# Patient Record
Sex: Female | Born: 1995 | Race: White | Hispanic: No | Marital: Single | Smoking: Never smoker
Health system: Southern US, Community
[De-identification: ages and names within clinical notes are randomized; demographics above are authoritative.]

---

## 2017-02-20 ENCOUNTER — Ambulatory Visit (INDEPENDENT_AMBULATORY_CARE_PROVIDER_SITE_OTHER): Payer: Managed Care, Other (non HMO)

## 2017-02-20 ENCOUNTER — Ambulatory Visit
Admission: EM | Admit: 2017-02-20 | Discharge: 2017-02-20 | Disposition: A | Discharge status: Home / Self Care | Payer: Managed Care, Other (non HMO) | Attending: Family Medicine | Admitting: Family Medicine

## 2017-02-20 ENCOUNTER — Encounter: Payer: Self-pay | Admitting: Emergency Medicine

## 2017-02-20 DIAGNOSIS — Z79899 Other long term (current) drug therapy: Secondary | ICD-10-CM | POA: Insufficient documentation

## 2017-02-20 DIAGNOSIS — R110 Nausea: Secondary | ICD-10-CM

## 2017-02-20 DIAGNOSIS — K2900 Acute gastritis without bleeding: Secondary | ICD-10-CM | POA: Diagnosis not present

## 2017-02-20 DIAGNOSIS — E876 Hypokalemia: Secondary | ICD-10-CM | POA: Insufficient documentation

## 2017-02-20 DIAGNOSIS — R05 Cough: Secondary | ICD-10-CM | POA: Insufficient documentation

## 2017-02-20 DIAGNOSIS — M940 Chondrocostal junction syndrome [Tietze]: Secondary | ICD-10-CM | POA: Diagnosis not present

## 2017-02-20 DIAGNOSIS — Z880 Allergy status to penicillin: Secondary | ICD-10-CM | POA: Diagnosis not present

## 2017-02-20 DIAGNOSIS — Z3202 Encounter for pregnancy test, result negative: Secondary | ICD-10-CM | POA: Diagnosis not present

## 2017-02-20 DIAGNOSIS — R1010 Upper abdominal pain, unspecified: Secondary | ICD-10-CM | POA: Diagnosis not present

## 2017-02-20 LAB — CBC WITH DIFFERENTIAL/PLATELET
BASOS ABS: 0.1 10*3/uL (ref 0–0.1)
BASOS PCT: 1 %
EOS PCT: 0 %
Eosinophils Absolute: 0 10*3/uL (ref 0–0.7)
HCT: 39.8 % (ref 35.0–47.0)
Hemoglobin: 13.7 g/dL (ref 12.0–16.0)
LYMPHS PCT: 23 %
Lymphs Abs: 1.9 10*3/uL (ref 1.0–3.6)
MCH: 30.7 pg (ref 26.0–34.0)
MCHC: 34.4 g/dL (ref 32.0–36.0)
MCV: 89.5 fL (ref 80.0–100.0)
MONO ABS: 0.4 10*3/uL (ref 0.2–0.9)
Monocytes Relative: 4 %
Neutro Abs: 5.9 10*3/uL (ref 1.4–6.5)
Neutrophils Relative %: 72 %
PLATELETS: 271 10*3/uL (ref 150–440)
RBC: 4.45 MIL/uL (ref 3.80–5.20)
RDW: 12.2 % (ref 11.5–14.5)
WBC: 8.3 10*3/uL (ref 3.6–11.0)

## 2017-02-20 LAB — URINALYSIS, COMPLETE (UACMP) WITH MICROSCOPIC
BILIRUBIN URINE: NEGATIVE
Bacteria, UA: NONE SEEN
Glucose, UA: NEGATIVE mg/dL
KETONES UR: NEGATIVE mg/dL
LEUKOCYTES UA: NEGATIVE
NITRITE: NEGATIVE
PH: 5.5 (ref 5.0–8.0)
PROTEIN: NEGATIVE mg/dL
RBC / HPF: NONE SEEN RBC/hpf (ref 0–5)
SQUAMOUS EPITHELIAL / LPF: NONE SEEN
Specific Gravity, Urine: <1.005 — ABNORMAL LOW (ref 1.005–1.030)

## 2017-02-20 LAB — LIPASE, BLOOD: Lipase: 21 U/L (ref 11–51)

## 2017-02-20 LAB — AMYLASE: AMYLASE: 72 U/L (ref 28–100)

## 2017-02-20 LAB — COMPREHENSIVE METABOLIC PANEL
ALBUMIN: 4.9 g/dL (ref 3.5–5.0)
ALT: 13 U/L — AB (ref 14–54)
AST: 21 U/L (ref 15–41)
Alkaline Phosphatase: 51 U/L (ref 38–126)
Anion gap: 9 (ref 5–15)
BUN: 10 mg/dL (ref 6–20)
CHLORIDE: 102 mmol/L (ref 101–111)
CO2: 24 mmol/L (ref 22–32)
CREATININE: 0.85 mg/dL (ref 0.44–1.00)
Calcium: 9.5 mg/dL (ref 8.9–10.3)
GFR calc Af Amer: >60 mL/min (ref >60)
GLUCOSE: 98 mg/dL (ref 65–99)
POTASSIUM: 3.4 mmol/L — AB (ref 3.5–5.1)
SODIUM: 135 mmol/L (ref 135–145)
Total Bilirubin: 1 mg/dL (ref 0.3–1.2)
Total Protein: 8.4 g/dL — ABNORMAL HIGH (ref 6.5–8.1)

## 2017-02-20 LAB — PREGNANCY, URINE: Preg Test, Ur: NEGATIVE

## 2017-02-20 MED ORDER — ONDANSETRON 8 MG PO TBDP
8.0000 mg | ORAL_TABLET | Freq: Once | ORAL | Status: AC
  Administered 2017-02-20: 8 mg via ORAL

## 2017-02-20 MED ORDER — POTASSIUM CHLORIDE CRYS ER 20 MEQ PO TBCR: 40.0000 meq | EXTENDED_RELEASE_TABLET | Freq: Once | ORAL | Status: DC

## 2017-02-20 MED ORDER — ONDANSETRON 8 MG PO TBDP: 8.0000 mg | ORAL_TABLET | Freq: Three times a day (TID) | ORAL | 0 refills | Status: AC | PRN

## 2017-02-20 MED ORDER — MELOXICAM 15 MG PO TABS
15.0000 mg | ORAL_TABLET | Freq: Every day | ORAL | 0 refills | Status: AC
Start: 2017-02-20 — End: ?

## 2017-02-20 NOTE — ED Provider Notes (Signed)
[facility]    CSN: [id number] Arrival date & time: 02/20/17  1058     History   Chief Complaint Chief Complaint  Patient presents with  . Abdominal Pain  . Nausea    HPI Kathleen Krueger is a 21 y.o. female.   Patient with multiple problems and not communicating that well either. She is a 21 year old white female states that she's been sick now for about 2-3 days. She reports aching all over having nausea yesterday and dry heaving yesterday she dry heaves a lot and then this morning she started having chest pain. She denies ever having chest pain before or even having dry heaving act this before. When asked she was still nauseous she says no but she still wants to throw up which is a contradiction in terms. During the discussion with the patient and her friend often has to ask her the same her father was sick with a URI but that was about a week ago. She denies any pertinent past smoker history pertinent surgical history. She states that 10 being feeling great she feels about a 6 right now and that she feels miserable she reports having a cough earlier but not cough now she still does have abdominal pain as well as the chest pain.   The history is provided by the patient. No language interpreter was used.  Abdominal Pain  Pain location:  Epigastric Pain quality: fullness, gnawing and pressure   Pain quality: not cramping   Pain radiates to:  Chest Pain severity:  Moderate Onset quality:  Sudden Duration:  2 days Timing:  Constant Progression:  Worsening Chronicity:  New Context: not medication withdrawal and not previous surgeries   Relieved by:  Nothing Worsened by:  Nothing Associated symptoms: chest pain, fatigue and nausea   Associated symptoms: no fever, no shortness of breath and no sore throat   Risk factors: no alcohol abuse, no aspirin use, has not had multiple surgeries and no NSAID use     History reviewed. No pertinent past medical  history.  There are no active problems to display for this patient.   History reviewed. No pertinent surgical history.  OB History    No data available       Home Medications    Prior to Admission medications   Medication Sig Start Date End Date Taking? Authorizing Provider  meloxicam (MOBIC) 15 MG tablet Take 1 tablet (15 mg total) by mouth daily. 02/20/17   Hassan Rowan, MD  ondansetron (ZOFRAN ODT) 8 MG disintegrating tablet Take 1 tablet (8 mg total) by mouth every 8 (eight) hours as needed for nausea or vomiting. 02/20/17   Hassan Rowan, MD    Family History History reviewed. No pertinent family history.  Social History Social History  Substance Use Topics  . Smoking status: Never Smoker  . Smokeless tobacco: Never Used  . Alcohol use No     Allergies   Amoxicillin   Review of Systems Review of Systems  Constitutional: Positive for fatigue. Negative for fever.  HENT: Negative for sore throat.   Respiratory: Negative for shortness of breath.   Cardiovascular: Positive for chest pain.  Gastrointestinal: Positive for abdominal pain and nausea.  All other systems reviewed and are negative.    Physical Exam Triage Vital Signs ED Triage Vitals  Enc Vitals Group     BP 02/20/17 1132 110/69     Pulse Rate 02/20/17 1132 79     Resp 02/20/17 1132 16  Temp 02/20/17 1132 98.4 F (36.9 C)     Temp Source 02/20/17 1132 Oral     SpO2 02/20/17 1132 100 %     Weight 02/20/17 1129 120 lb (54.4 kg)     Height 02/20/17 1129 5\' 6"  (1.676 m)     Head Circumference --      Peak Flow --      Pain Score 02/20/17 1130 4     Pain Loc --      Pain Edu? --      Excl. in GC? --    No data found.   Updated Vital Signs BP 110/69 (BP Location: Left Arm)   Pulse 79   Temp 98.4 F (36.9 C) (Oral)   Resp 16   Ht 5\' 6"  (1.676 m)   Wt 120 lb (54.4 kg)   LMP 02/19/2017 (Exact Date) Comment: hcg neg  SpO2 100%   BMI 19.37 kg/m   Visual Acuity Right Eye Distance:   Left  Eye Distance:   Bilateral Distance:    Right Eye Near:   Left Eye Near:    Bilateral Near:     Physical Exam  Constitutional: She is oriented to person, place, and time. She appears well-developed and well-nourished.  HENT:  Head: Normocephalic and atraumatic.  Right Ear: External ear normal.  Left Ear: External ear normal.  Eyes: Pupils are equal, round, and reactive to light.  Neck: Normal range of motion. Neck supple. No thyromegaly present.  Cardiovascular: Normal rate, regular rhythm and normal heart sounds.   Pulmonary/Chest: Effort normal and breath sounds normal.  Abdominal: Soft. She exhibits no distension and no mass. There is no hepatosplenomegaly. There is generalized tenderness. There is no guarding. No hernia. Hernia confirmed negative in the ventral area.  Musculoskeletal: Normal range of motion.  Neurological: She is alert and oriented to person, place, and time. No cranial nerve deficit.  Skin: Skin is warm.  Psychiatric: She has a normal mood and affect.  Vitals reviewed.    UC Treatments / Results  Labs (all labs ordered are listed, but only abnormal results are displayed) Labs Reviewed  COMPREHENSIVE METABOLIC PANEL - Abnormal; Notable for the following:       Result Value   Potassium 3.4 (*)    Total Protein 8.4 (*)    ALT 13 (*)    All other components within normal limits  URINALYSIS, COMPLETE (UACMP) WITH MICROSCOPIC - Abnormal; Notable for the following:    Color, Urine STRAW (*)    Specific Gravity, Urine <1.005 (*)    Hgb urine dipstick MODERATE (*)    All other components within normal limits  CBC WITH DIFFERENTIAL/PLATELET  AMYLASE  LIPASE, BLOOD  PREGNANCY, URINE    EKG  EKG Interpretation None     ED ECG REPORT I, Fronnie Urton H, the attending physician, personally viewed and interpreted this ECG.   Date: 02/20/2017  EKG Time: 12:24:29  Rhythm: there are no previous tracings available for comparison, normal sinus rhythm with  sinus arrthymia  Axis: 40  Intervals:none  ST&T Change: none  Radiology Dg Abd Acute W/chest  Result Date: 02/20/2017 CLINICAL DATA:  Nausea, dry heaving for 2 days EXAM: DG ABDOMEN ACUTE W/ 1V CHEST COMPARISON:  None. FINDINGS: There is no evidence of dilated bowel loops or free intraperitoneal air. No radiopaque calculi or other significant radiographic abnormality is seen. Heart size and mediastinal contours are within normal limits. Both lungs are clear. IMPRESSION: Negative abdominal radiographs.  No acute  cardiopulmonary disease. Electronically Signed   By: Elige Ko   On: 02/20/2017 12:49    Procedures Procedures (including critical care time)  Medications Ordered in UC Medications  potassium chloride SA (K-DUR,KLOR-CON) CR tablet 40 mEq (40 mEq Oral Not Given 02/20/17 1311)  ondansetron (ZOFRAN-ODT) disintegrating tablet 8 mg (8 mg Oral Given 02/20/17 1216)   Results for orders placed or performed during the hospital encounter of 02/20/17  CBC with Differential  Result Value Ref Range   WBC 8.3 3.6 - 11.0 K/uL   RBC 4.45 3.80 - 5.20 MIL/uL   Hemoglobin 13.7 12.0 - 16.0 g/dL   HCT 16.1 09.6 - 04.5 %   MCV 89.5 80.0 - 100.0 fL   MCH 30.7 26.0 - 34.0 pg   MCHC 34.4 32.0 - 36.0 g/dL   RDW 40.9 81.1 - 91.4 %   Platelets 271 150 - 440 K/uL   Neutrophils Relative % 72 %   Neutro Abs 5.9 1.4 - 6.5 K/uL   Lymphocytes Relative 23 %   Lymphs Abs 1.9 1.0 - 3.6 K/uL   Monocytes Relative 4 %   Monocytes Absolute 0.4 0.2 - 0.9 K/uL   Eosinophils Relative 0 %   Eosinophils Absolute 0.0 0 - 0.7 K/uL   Basophils Relative 1 %   Basophils Absolute 0.1 0 - 0.1 K/uL  Comprehensive metabolic panel  Result Value Ref Range   Sodium 135 135 - 145 mmol/L   Potassium 3.4 (L) 3.5 - 5.1 mmol/L   Chloride 102 101 - 111 mmol/L   CO2 24 22 - 32 mmol/L   Glucose, Bld 98 65 - 99 mg/dL   BUN 10 6 - 20 mg/dL   Creatinine, Ser 7.82 0.44 - 1.00 mg/dL   Calcium 9.5 8.9 - 95.6 mg/dL   Total Protein  8.4 (H) 6.5 - 8.1 g/dL   Albumin 4.9 3.5 - 5.0 g/dL   AST 21 15 - 41 U/L   ALT 13 (L) 14 - 54 U/L   Alkaline Phosphatase 51 38 - 126 U/L   Total Bilirubin 1.0 0.3 - 1.2 mg/dL   GFR calc non Af Amer >60 >60 mL/min   GFR calc Af Amer >60 >60 mL/min   Anion gap 9 5 - 15  Amylase  Result Value Ref Range   Amylase 72 28 - 100 U/L  Lipase, blood  Result Value Ref Range   Lipase 21 11 - 51 U/L  Urinalysis, Complete w Microscopic  Result Value Ref Range   Color, Urine STRAW (A) YELLOW   APPearance CLEAR CLEAR   Specific Gravity, Urine <1.005 (L) 1.005 - 1.030   pH 5.5 5.0 - 8.0   Glucose, UA NEGATIVE NEGATIVE mg/dL   Hgb urine dipstick MODERATE (A) NEGATIVE   Bilirubin Urine NEGATIVE NEGATIVE   Ketones, ur NEGATIVE NEGATIVE mg/dL   Protein, ur NEGATIVE NEGATIVE mg/dL   Nitrite NEGATIVE NEGATIVE   Leukocytes, UA NEGATIVE NEGATIVE   Squamous Epithelial / LPF NONE SEEN NONE SEEN   WBC, UA 0-5 0 - 5 WBC/hpf   RBC / HPF NONE SEEN 0 - 5 RBC/hpf   Bacteria, UA NONE SEEN NONE SEEN  Pregnancy, urine  Result Value Ref Range   Preg Test, Ur NEGATIVE NEGATIVE    Initial Impression / Assessment and Plan / UC Course  I have reviewed the triage vital signs and the nursing notes.  Pertinent labs & imaging results that were available during my care of the patient were reviewed by me and  considered in my medical decision making (see chart for details).   after all that was done patient was found to be hyperkalemic mildly. I think she also has costochondritis. Offer shot of Toradol she declined will treat with Mobic 15 mg 1 tablet a day Zofran for nausea. Going give her 40 mg of KCl while she was here to resolve the issue hyperkalemia but she refused to take the pills. Will recommend that she eat bananas when she gets home to replenish her potassium. her schoolwork given printed today and tomorrow. And Zofran for the nausea.    Final Clinical Impressions(s) / UC Diagnoses   Final diagnoses:   Costochondritis, acute  Hypokalemia  Acute superficial gastritis without hemorrhage  Nausea  Pain of upper abdomen    New Prescriptions New Prescriptions   MELOXICAM (MOBIC) 15 MG TABLET    Take 1 tablet (15 mg total) by mouth daily.   ONDANSETRON (ZOFRAN ODT) 8 MG DISINTEGRATING TABLET    Take 1 tablet (8 mg total) by mouth every 8 (eight) hours as needed for nausea or vomiting.     Hassan RowanEugene Ashton Sabine, MD 02/20/17 1322

## 2017-02-20 NOTE — ED Triage Notes (Signed)
Patient c/o gagging, chest discomfort and nausea that started last night.

## 2018-01-22 IMAGING — CR DG ABDOMEN ACUTE W/ 1V CHEST
4 series · 4 of 4 positions shown · non-contrast
Comparison: None.

CLINICAL DATA: Nausea, dry heaving for 2 days

EXAM:
DG ABDOMEN ACUTE W/ 1V CHEST

[chest pa]
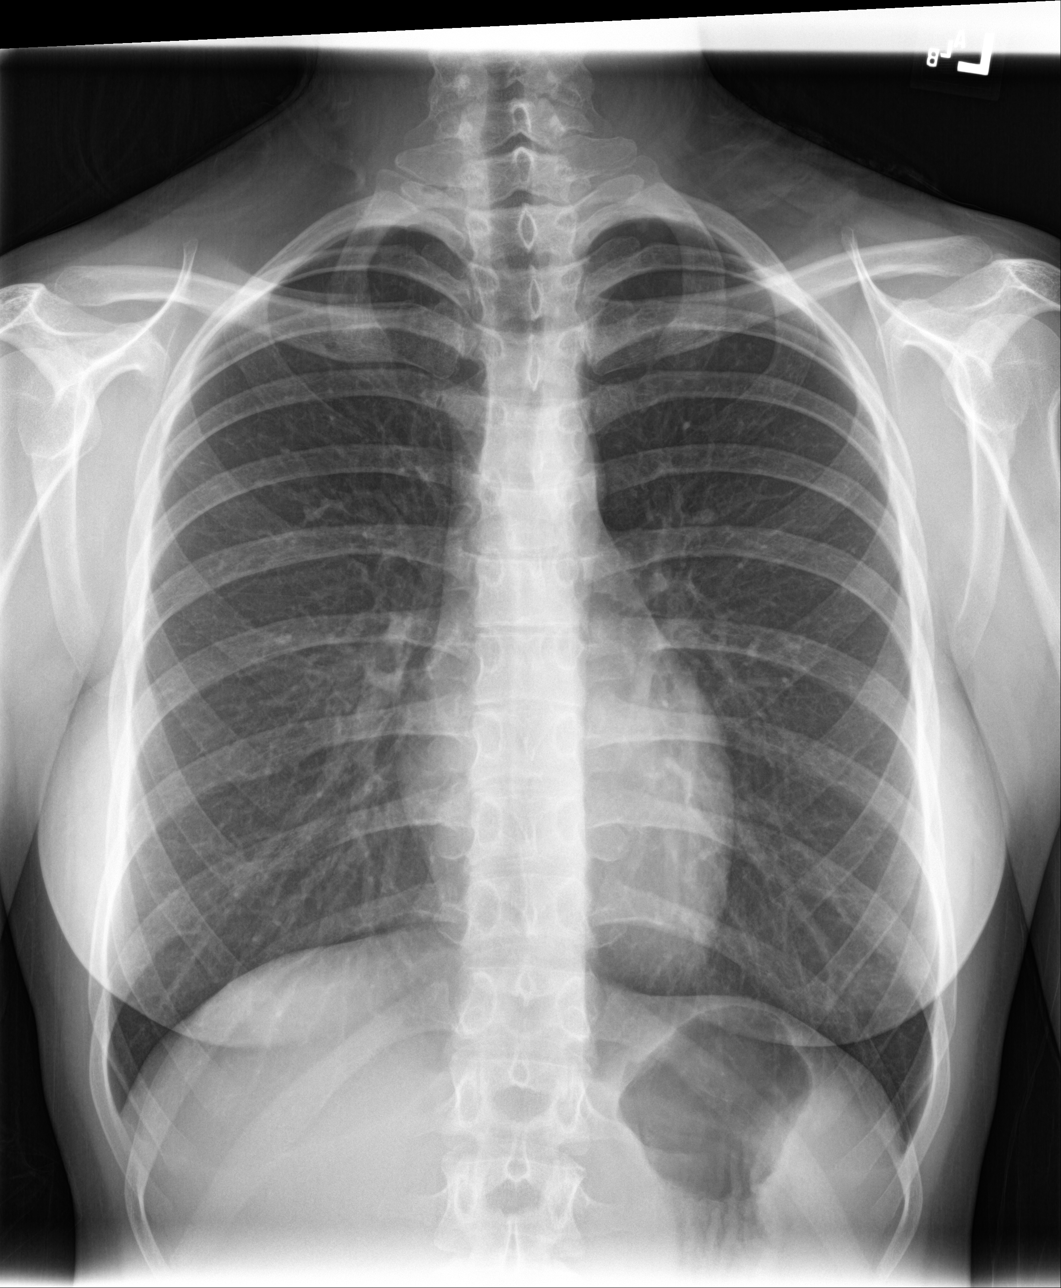

[abdomen erect]
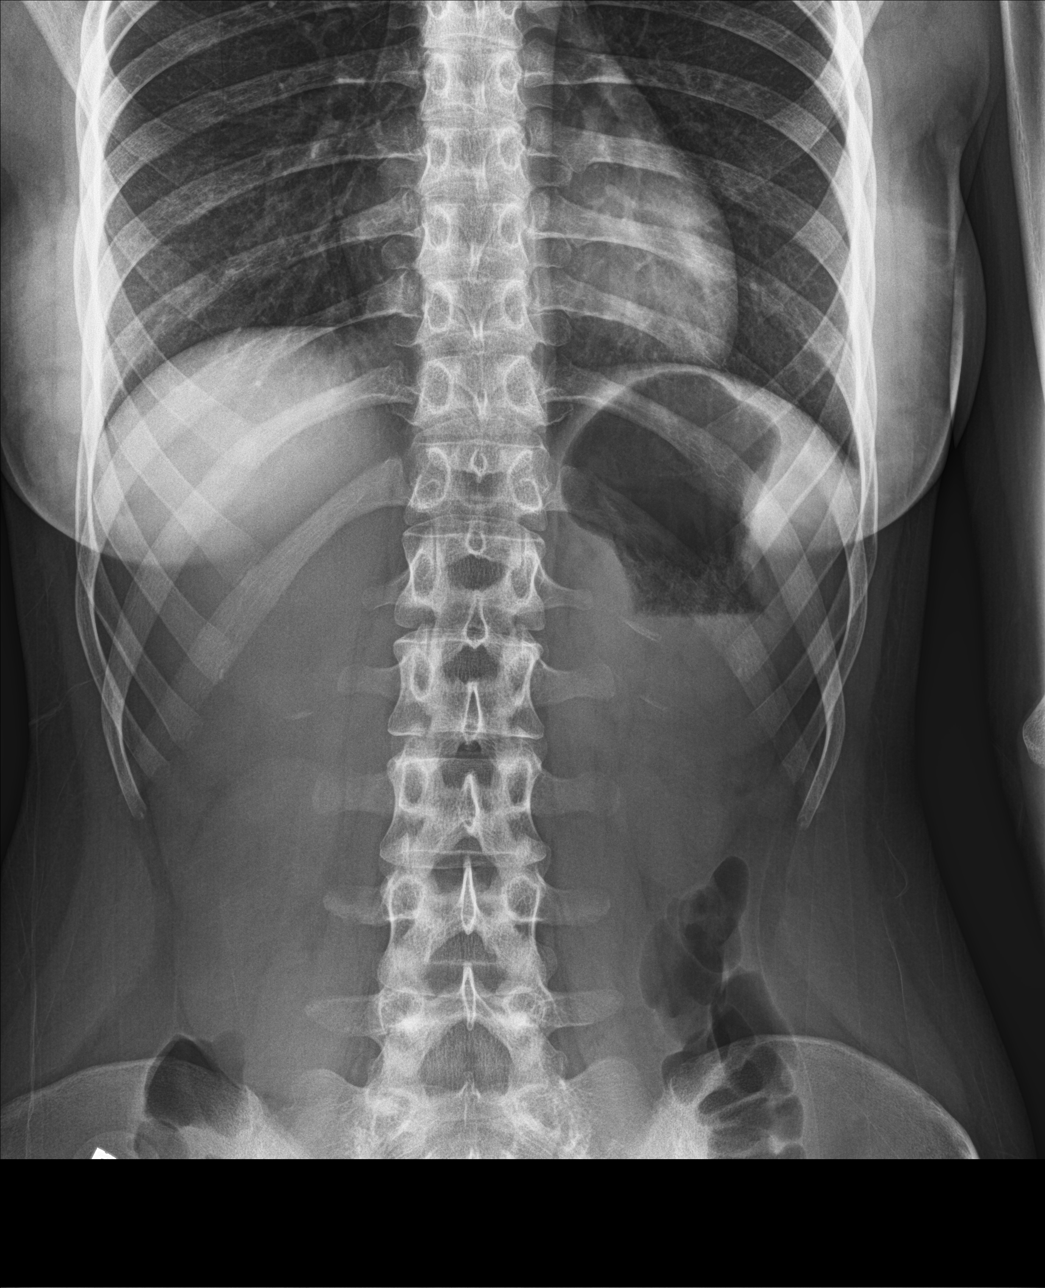

[abdomen supine (1 of 2)]
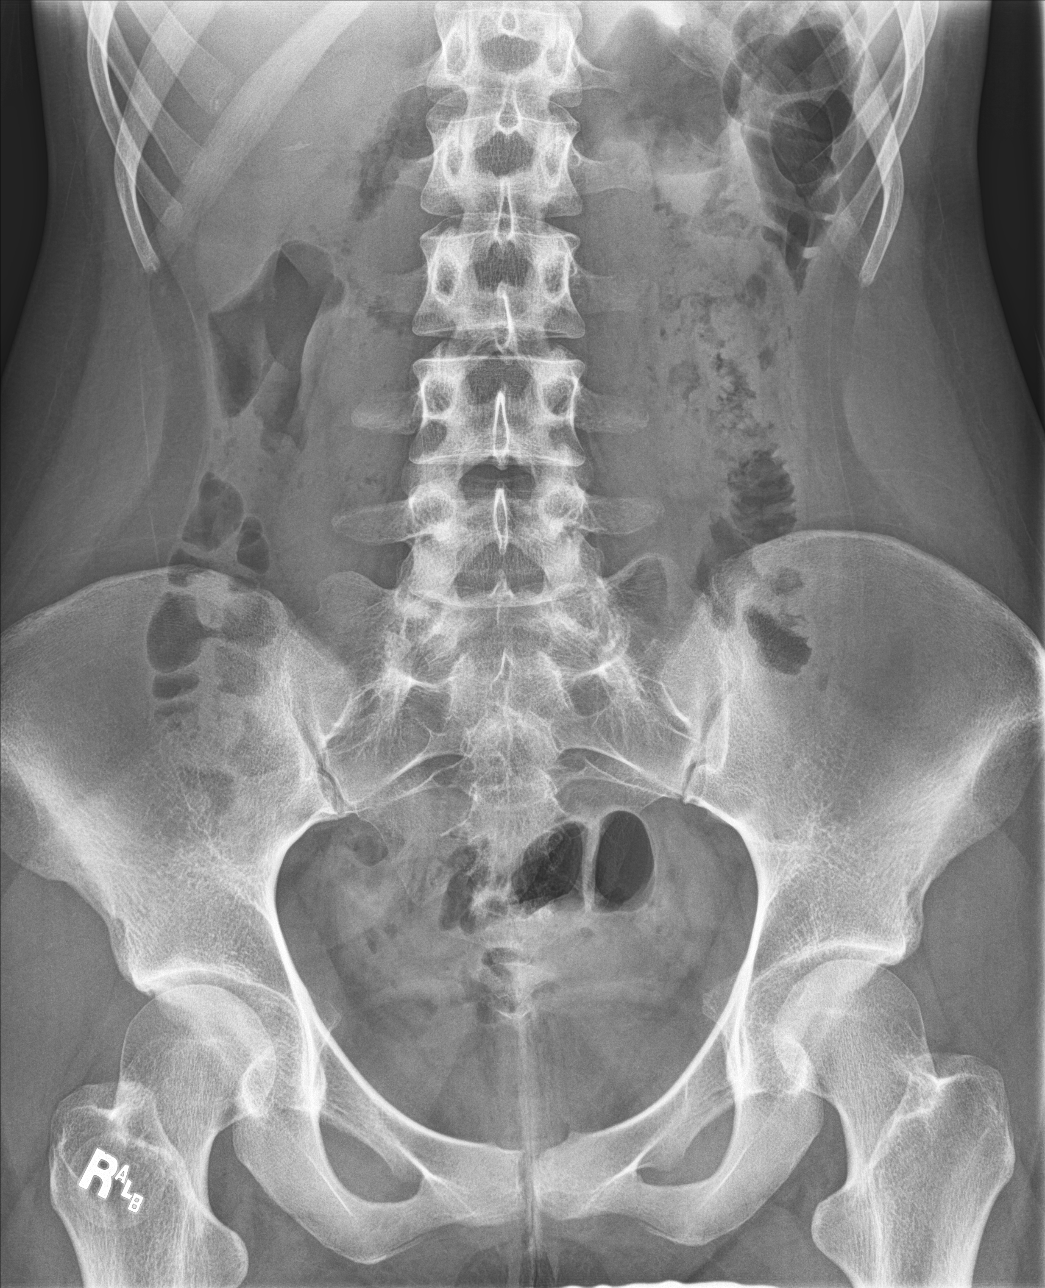

[abdomen supine (2 of 2)]
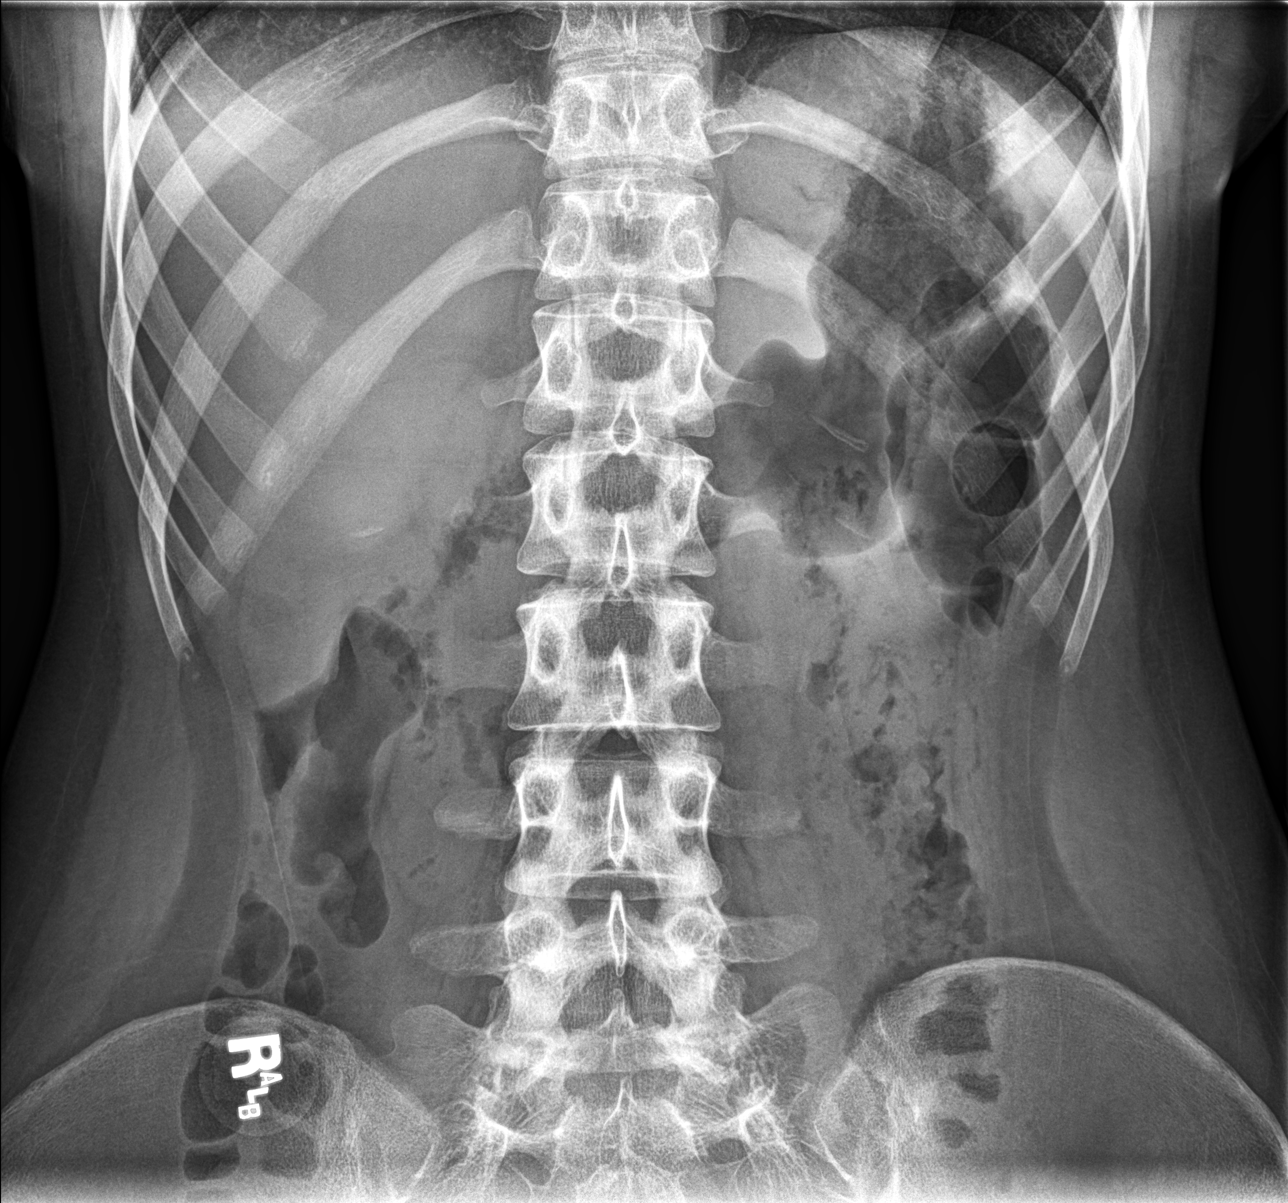

[4 of 4 positions shown; findings below may reference images not displayed]

FINDINGS: There is no evidence of dilated bowel loops or free intraperitoneal
air. No radiopaque calculi or other significant radiographic
abnormality is seen. Heart size and mediastinal contours are within
normal limits. Both lungs are clear.
IMPRESSION: Negative abdominal radiographs.  No acute cardiopulmonary disease.
